# Patient Record
Sex: Male | Born: 1990 | Race: White | Hispanic: No | Marital: Married | Smoking: Never smoker
Health system: Southern US, Community
[De-identification: ages and names within clinical notes are randomized; demographics above are authoritative.]

---
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
## 2011-04-07 DIAGNOSIS — R079 Chest pain, unspecified: Secondary | ICD-10-CM | POA: Insufficient documentation

## 2011-04-07 DIAGNOSIS — J9801 Acute bronchospasm: Secondary | ICD-10-CM | POA: Insufficient documentation

## 2011-04-08 ENCOUNTER — Emergency Department (HOSPITAL_COMMUNITY)
Admission: EM | Admit: 2011-04-08 | Discharge: 2011-04-08 | Disposition: A | Discharge status: Home / Self Care | Payer: BC Managed Care – PPO | Attending: Emergency Medicine | Admitting: Emergency Medicine

## 2011-04-08 ENCOUNTER — Emergency Department (HOSPITAL_COMMUNITY): Payer: BC Managed Care – PPO

## 2011-04-08 ENCOUNTER — Encounter: Payer: Self-pay | Admitting: Emergency Medicine

## 2011-04-08 DIAGNOSIS — J9801 Acute bronchospasm: Secondary | ICD-10-CM

## 2011-04-08 LAB — CBC
HCT: 40.3 % (ref 39.0–52.0)
Hemoglobin: 14.5 g/dL (ref 13.0–17.0)
MCH: 30.9 pg (ref 26.0–34.0)
MCHC: 36 g/dL (ref 30.0–36.0)
MCV: 85.7 fL (ref 78.0–100.0)
Platelets: 238 10*3/uL (ref 150–400)
RBC: 4.7 MIL/uL (ref 4.22–5.81)
RDW: 12.7 % (ref 11.5–15.5)
WBC: 13.3 10*3/uL — ABNORMAL HIGH (ref 4.0–10.5)

## 2011-04-08 LAB — BASIC METABOLIC PANEL
BUN: 10 mg/dL (ref 6–23)
CO2: 27 mEq/L (ref 19–32)
Calcium: 9.7 mg/dL (ref 8.4–10.5)
Chloride: 103 mEq/L (ref 96–112)
Creatinine, Ser: 0.81 mg/dL (ref 0.50–1.35)
GFR calc Af Amer: >90 mL/min (ref >90)
GFR calc non Af Amer: >90 mL/min (ref >90)
Glucose, Bld: 124 mg/dL — ABNORMAL HIGH (ref 70–99)
Potassium: 4.4 mEq/L (ref 3.5–5.1)
Sodium: 140 mEq/L (ref 135–145)

## 2011-04-08 LAB — D-DIMER, QUANTITATIVE (NOT AT ARMC): D-Dimer, Quant: 0.27 ug/mL-FEU (ref 0.00–0.48)

## 2011-04-08 MED ORDER — FENTANYL CITRATE 0.05 MG/ML IJ SOLN
INTRAMUSCULAR | Status: AC
  Filled 2011-04-08: qty 2

## 2011-04-08 MED ORDER — ALBUTEROL SULFATE HFA 108 (90 BASE) MCG/ACT IN AERS
2.0000 | INHALATION_SPRAY | RESPIRATORY_TRACT | Status: DC
  Administered 2011-04-08: 2 via RESPIRATORY_TRACT
  Filled 2011-04-08: qty 6.7

## 2011-04-08 MED ORDER — ALBUTEROL SULFATE (5 MG/ML) 0.5% IN NEBU
5.0000 mg | INHALATION_SOLUTION | Freq: Once | RESPIRATORY_TRACT | Status: AC
  Administered 2011-04-08: 5 mg via RESPIRATORY_TRACT
  Filled 2011-04-08: qty 1

## 2011-04-08 MED ORDER — IPRATROPIUM BROMIDE 0.02 % IN SOLN
0.5000 mg | Freq: Once | RESPIRATORY_TRACT | Status: AC
  Administered 2011-04-08: 0.5 mg via RESPIRATORY_TRACT
  Filled 2011-04-08: qty 2.5

## 2011-04-08 NOTE — ED Provider Notes (Signed)
History     CSN: [id number] Arrival date & time: 04/08/2011 12:31 AM   First MD Initiated Contact with Patient 04/08/11 0105      Chief Complaint  Patient presents with  . Chest Pain    (Consider location/radiation/quality/duration/timing/severity/associated sxs/prior treatment) HPI Patient reports developing chest pain chest tightness and shortness of breath during a basketball game this evening.  He reports after stopping his chest pain improved slightly however he continued to feel short of breath.  He denies unilateral leg swelling.  He denies recent travel or surgery.  The family history of DVT or PE.  No family history of early cardiac disease.  He does report he is slightly nervous at this time.  His had cough with mild congestion for 24-48 hours.  He denies fever and chills.  He has no prior history of asthma.  He reports he has not played basketball as often as he usually does and feels as though he may be a well-developed shape however when the shortness of breath continued despite stopping he presented to the ER for evaluation.  He reports his symptoms are improved at this time without any therapy however he still is mildly dyspneic.  Nothing worsens the symptoms.  Nothing improves his symptoms.  His symptoms are mild to moderate at this time.  History reviewed. No pertinent past medical history.  History reviewed. No pertinent past surgical history.  No family history on file.  History  Substance Use Topics  . Smoking status: Never Smoker   . Smokeless tobacco: Not on file  . Alcohol Use: No      Review of Systems  All other systems reviewed and are negative.    Allergies  Review of patient's allergies indicates no known allergies.  Home Medications  No current outpatient prescriptions on file.  BP 120/69  Pulse 97  Temp(Src) 98.4 F (36.9 C) (Oral)  Resp 18  SpO2 100%  Physical Exam  Nursing note and vitals reviewed. Constitutional: He is oriented to  person, place, and time. He appears well-developed and well-nourished.  HENT:  Head: Normocephalic and atraumatic.  Eyes: EOM are normal.  Neck: Normal range of motion.  Cardiovascular: Normal rate, regular rhythm, normal heart sounds and intact distal pulses.   Pulmonary/Chest: Effort normal and breath sounds normal. No respiratory distress.  Abdominal: Soft. He exhibits no distension. There is no tenderness.  Musculoskeletal: Normal range of motion. He exhibits no edema.  Neurological: He is alert and oriented to person, place, and time.  Skin: Skin is warm and dry.  Psychiatric: He has a normal mood and affect. Judgment normal.    ED Course  Procedures (including critical care time)   Date: 04/08/2011  Rate: 91  Rhythm: normal sinus rhythm  QRS Axis: normal  Intervals: normal  ST/T Wave abnormalities: normal  Conduction Disutrbances:none  Narrative Interpretation:   Old EKG Reviewed: No significant changes noted     Labs Reviewed  CBC - Abnormal; Notable for the following:    WBC 13.3 (*)    All other components within normal limits  BASIC METABOLIC PANEL - Abnormal; Notable for the following:    Glucose, Bld 124 (*)    All other components within normal limits  D-DIMER, QUANTITATIVE   Dg Chest 2 View  04/08/2011  *RADIOLOGY REPORT*  Clinical Data: Shortness of breath and mid chest pain.  CHEST - 2 VIEW  Comparison: None.  Findings: The lungs are well-aerated and clear.  There is no evidence of focal  opacification, pleural effusion or pneumothorax.  The heart is normal in size; the mediastinal contour is within normal limits.  No acute osseous abnormalities are seen.  IMPRESSION: No acute cardiopulmonary process seen.  Original Report Authenticated By: Tonia Ghent, M.D.   I personally reviewed the x-ray  1. Bronchospasm       MDM  Given the patient's complaint is tachycardia d-dimer will be obtained.  My suspicion for pulmonary embolus but still very low.   Doubt ACS.  EKG is normal.  Chest x-ray is normal.  Baseline labs including a hemoglobin pending at this time.  He may have a component of bronchitis/bronchospasm and thus the treatment of albuterol and Atrovent as being tried at this time  3:06 AM Patient feels much better at this time.  His symptoms have completely resolved with albuterol treatment in the ER.  He'll be sent home with a metered-dose inhaler.  Labs chest x-ray EKG and d-dimer all without significant abnormality     Lyanne Co, MD 04/08/11 [id number]

## 2011-04-08 NOTE — ED Notes (Signed)
The pt finished his hhn feeling better  Falling asleep

## 2011-04-08 NOTE — ED Notes (Signed)
PT. REPORTS CHEST PAIN / SOB / CHEST CONGESTION ONSET THIS AFTERNOON WHILE PLAYING BASKETBALL.

## 2012-08-14 IMAGING — CR DG CHEST 2V
2 series · 2 of 2 positions shown · non-contrast
Comparison: None.

CLINICAL DATA: Shortness of breath and mid chest pain.

CHEST - 2 VIEW

[w chest pa]
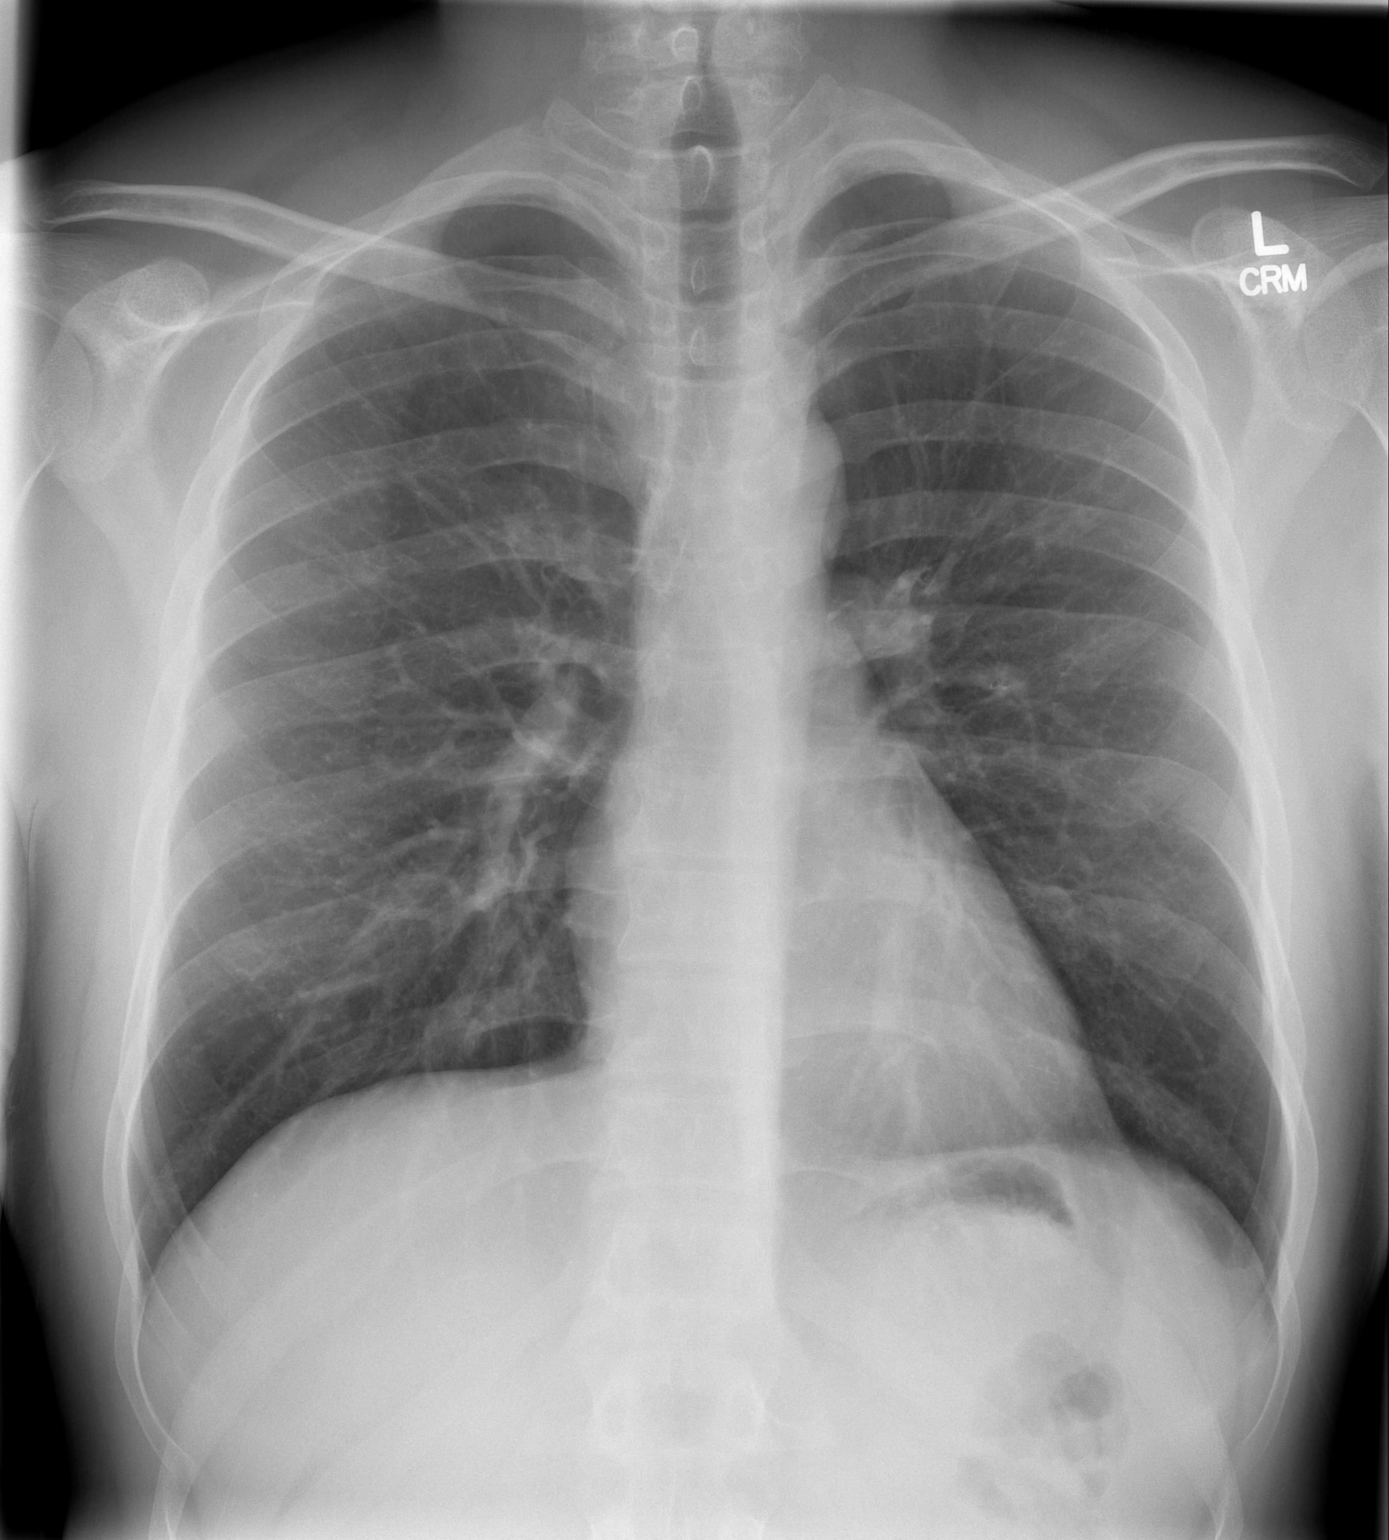

[w chest lat]
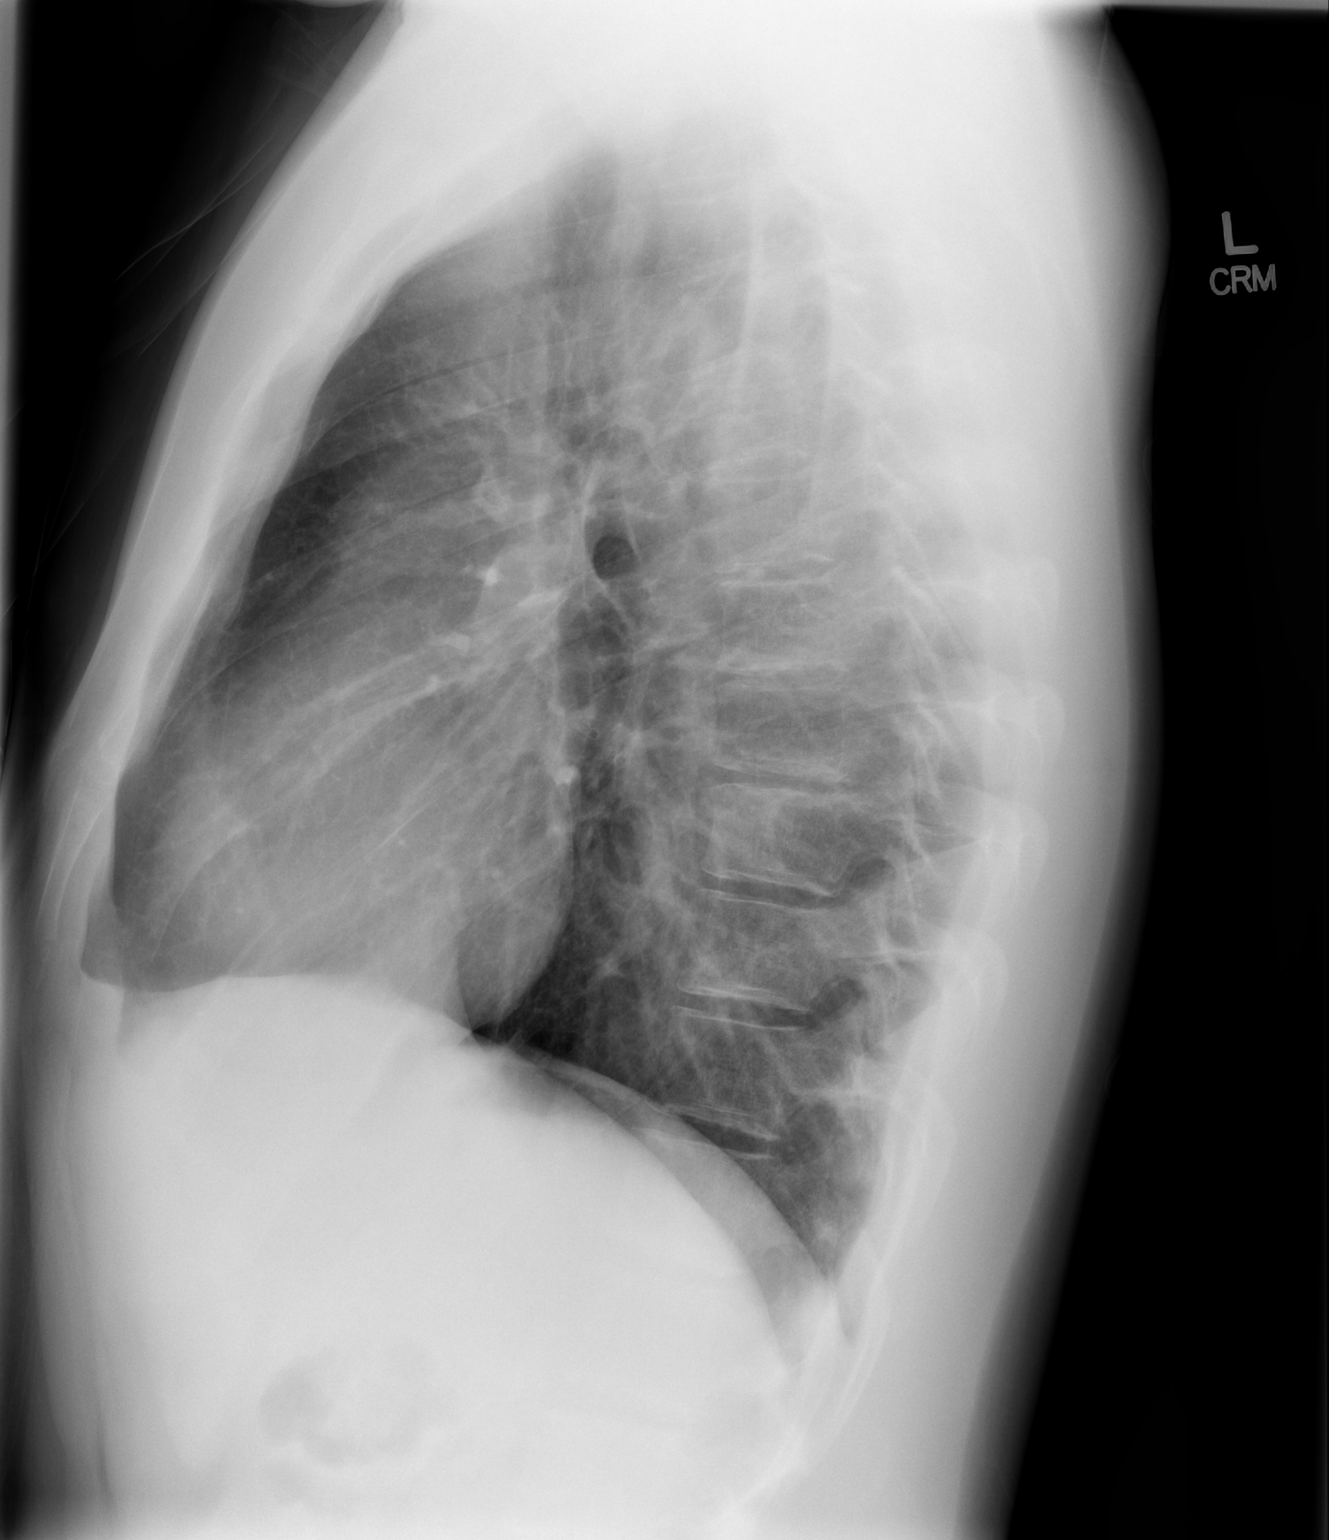

[2 of 2 positions shown; findings below may reference images not displayed]

FINDINGS: The lungs are well-aerated and clear.  There is no
evidence of focal opacification, pleural effusion or pneumothorax.

The heart is normal in size; the mediastinal contour is within
normal limits.  No acute osseous abnormalities are seen.
IMPRESSION: No acute cardiopulmonary process seen.

## 2021-01-25 ENCOUNTER — Emergency Department (HOSPITAL_BASED_OUTPATIENT_CLINIC_OR_DEPARTMENT_OTHER)
Admission: EM | Admit: 2021-01-25 | Discharge: 2021-01-25 | Disposition: A | Discharge status: Home / Self Care | Payer: Commercial Managed Care - PPO | Attending: Emergency Medicine | Admitting: Emergency Medicine

## 2021-01-25 ENCOUNTER — Other Ambulatory Visit: Payer: Self-pay

## 2021-01-25 ENCOUNTER — Encounter (HOSPITAL_BASED_OUTPATIENT_CLINIC_OR_DEPARTMENT_OTHER): Payer: Self-pay | Admitting: Emergency Medicine

## 2021-01-25 ENCOUNTER — Encounter: Payer: Self-pay | Admitting: Physician Assistant

## 2021-01-25 DIAGNOSIS — Z20822 Contact with and (suspected) exposure to covid-19: Secondary | ICD-10-CM | POA: Diagnosis not present

## 2021-01-25 DIAGNOSIS — K529 Noninfective gastroenteritis and colitis, unspecified: Secondary | ICD-10-CM | POA: Diagnosis not present

## 2021-01-25 DIAGNOSIS — R110 Nausea: Secondary | ICD-10-CM | POA: Diagnosis present

## 2021-01-25 DIAGNOSIS — A084 Viral intestinal infection, unspecified: Secondary | ICD-10-CM

## 2021-01-25 LAB — CBC
HCT: 45.9 % (ref 39.0–52.0)
Hemoglobin: 16.4 g/dL (ref 13.0–17.0)
MCH: 30.4 pg (ref 26.0–34.0)
MCHC: 35.7 g/dL (ref 30.0–36.0)
MCV: 85 fL (ref 80.0–100.0)
Platelets: 275 10*3/uL (ref 150–400)
RBC: 5.4 MIL/uL (ref 4.22–5.81)
RDW: 12.5 % (ref 11.5–15.5)
WBC: 15.2 10*3/uL — ABNORMAL HIGH (ref 4.0–10.5)
nRBC: 0 % (ref 0.0–0.2)

## 2021-01-25 LAB — URINALYSIS, ROUTINE W REFLEX MICROSCOPIC
Bilirubin Urine: NEGATIVE
Glucose, UA: NEGATIVE mg/dL
Hgb urine dipstick: NEGATIVE
Ketones, ur: NEGATIVE mg/dL
Leukocytes,Ua: NEGATIVE
Nitrite: NEGATIVE
Specific Gravity, Urine: 1.035 — ABNORMAL HIGH (ref 1.005–1.030)
pH: 5 (ref 5.0–8.0)

## 2021-01-25 LAB — COMPREHENSIVE METABOLIC PANEL
ALT: 41 U/L (ref 0–44)
AST: 23 U/L (ref 15–41)
Albumin: 4.9 g/dL (ref 3.5–5.0)
Alkaline Phosphatase: 53 U/L (ref 38–126)
Anion gap: 10 (ref 5–15)
BUN: 15 mg/dL (ref 6–20)
CO2: 24 mmol/L (ref 22–32)
Calcium: 9.9 mg/dL (ref 8.9–10.3)
Chloride: 104 mmol/L (ref 98–111)
Creatinine, Ser: 0.87 mg/dL (ref 0.61–1.24)
GFR, Estimated: >60 mL/min (ref >60)
Glucose, Bld: 121 mg/dL — ABNORMAL HIGH (ref 70–99)
Potassium: 4.3 mmol/L (ref 3.5–5.1)
Sodium: 138 mmol/L (ref 135–145)
Total Bilirubin: 2 mg/dL — ABNORMAL HIGH (ref 0.3–1.2)
Total Protein: 7.8 g/dL (ref 6.5–8.1)

## 2021-01-25 LAB — RESP PANEL BY RT-PCR (FLU A&B, COVID) ARPGX2
Influenza A by PCR: NEGATIVE
Influenza B by PCR: NEGATIVE
SARS Coronavirus 2 by RT PCR: NEGATIVE

## 2021-01-25 LAB — LIPASE, BLOOD: Lipase: 12 U/L (ref 11–51)

## 2021-01-25 MED ORDER — ONDANSETRON 4 MG PO TBDP: 4.0000 mg | ORAL_TABLET | Freq: Three times a day (TID) | ORAL | 0 refills | Status: AC | PRN

## 2021-01-25 MED ORDER — ONDANSETRON 4 MG PO TBDP
4.0000 mg | ORAL_TABLET | Freq: Once | ORAL | Status: AC
  Administered 2021-01-25: 4 mg via ORAL
  Filled 2021-01-25: qty 1

## 2021-01-25 MED ORDER — ACETAMINOPHEN 500 MG PO TABS
1000.0000 mg | ORAL_TABLET | Freq: Once | ORAL | Status: AC
  Administered 2021-01-25: 1000 mg via ORAL
  Filled 2021-01-25: qty 2

## 2021-01-25 NOTE — Progress Notes (Signed)
Patient scheduled appt but appears to have went to ED. Provider sent link to notify patient but no attempt to log in made. Suspect he decided to be seen in person instead. Marked erroneous

## 2021-01-25 NOTE — ED Provider Notes (Signed)
[facility] EMERGENCY DEPT Provider Note   CSN: [id number] Arrival date & time: 01/25/21  1633     History Chief Complaint  Patient presents with   Nausea   Diarrhea    Jared Gardner is a 30 y.o. male who presents with nausea and diarrhea that started earlier today.  Patient states that he woke up this morning with nausea that got worse throughout the day.  He also reports approximately 6 episodes of loose watery stools.  At that time had generalized abdominal aches.  Denies recent changes in diet, travel, or recent antibiotic use. No one else at home is sick.   Diarrhea Associated symptoms: abdominal pain and myalgias   Associated symptoms: no chills, no fever and no vomiting       History reviewed. No pertinent past medical history.  There are no problems to display for this patient.   History reviewed. No pertinent surgical history.     History reviewed. No pertinent family history.  Social History   Tobacco Use   Smoking status: Never   Smokeless tobacco: Never  Substance Use Topics   Alcohol use: Never   Drug use: Never    Home Medications Prior to Admission medications   Medication Sig Start Date End Date Taking? Authorizing Provider  ondansetron (ZOFRAN ODT) 4 MG disintegrating tablet Take 1 tablet (4 mg total) by mouth every 8 (eight) hours as needed for nausea or vomiting. 01/25/21  Yes Jailyn Langhorst T, PA-C    Allergies    Patient has no known allergies.  Review of Systems   Review of Systems  Constitutional:  Negative for chills and fever.  Respiratory:  Negative for shortness of breath.   Cardiovascular:  Negative for chest pain.  Gastrointestinal:  Positive for abdominal pain, diarrhea and nausea. Negative for blood in stool and vomiting.  Genitourinary:  Negative for dysuria and flank pain.  Musculoskeletal:  Positive for myalgias.  All other systems reviewed and are negative.  Physical Exam Updated Vital Signs BP (!)  116/59 (BP Location: Right Arm)   Pulse 93   Temp 100.1 F (37.8 C) (Oral)   Resp 16   Ht 6' (1.829 m)   Wt 99.8 kg   SpO2 98%   BMI 29.84 kg/m   Physical Exam Vitals and nursing note reviewed.  Constitutional:      Appearance: Normal appearance.  HENT:     Head: Normocephalic and atraumatic.  Eyes:     Conjunctiva/sclera: Conjunctivae normal.  Cardiovascular:     Rate and Rhythm: Normal rate and regular rhythm.  Pulmonary:     Effort: Pulmonary effort is normal. No respiratory distress.     Breath sounds: Normal breath sounds.  Abdominal:     General: There is no distension.     Palpations: Abdomen is soft.     Tenderness: There is no abdominal tenderness. There is no guarding or rebound.  Skin:    General: Skin is warm and dry.  Neurological:     General: No focal deficit present.     Mental Status: He is alert.    ED Results / Procedures / Treatments   Labs (all labs ordered are listed, but only abnormal results are displayed) Labs Reviewed  COMPREHENSIVE METABOLIC PANEL - Abnormal; Notable for the following components:      Result Value   Glucose, Bld 121 (*)    Total Bilirubin 2.0 (*)    All other components within normal limits  CBC - Abnormal; Notable  for the following components:   WBC 15.2 (*)    All other components within normal limits  URINALYSIS, ROUTINE W REFLEX MICROSCOPIC - Abnormal; Notable for the following components:   Specific Gravity, Urine 1.035 (*)    Protein, ur TRACE (*)    All other components within normal limits  RESP PANEL BY RT-PCR (FLU A&B, COVID) ARPGX2  LIPASE, BLOOD    EKG None  Radiology No results found.  Procedures Procedures   Medications Ordered in ED Medications  ondansetron (ZOFRAN-ODT) disintegrating tablet 4 mg (4 mg Oral Given 01/25/21 2037)  acetaminophen (TYLENOL) tablet 1,000 mg (1,000 mg Oral Given 01/25/21 2131)    ED Course  I have reviewed the triage vital signs and the nursing notes.  Pertinent  labs & imaging results that were available during my care of the patient were reviewed by me and considered in my medical decision making (see chart for details).    MDM Rules/Calculators/A&P                           Patient is 30 y/o male who presents for nausea, abdominal pain, and diarrhea starting this morning. He had approximately 6 episodes of non-bloody diarrhea today.  On exam patient is afebrile and in no acute distress. His abdomen is soft, nontender and non-distended.  He states that his symptoms began to improve approximately 2 hours before arriving in the emergency department.   CBC shows mild leukocytosis likely in the setting of viral gastroenteritis. CMP, urinalysis, and lipase unremarkable. On reevaluation patient had fever to 101.3 F, given tylenol. Abdominal exam remained stable.   Due to reassuring vital signs and physical exam, patient is not requiring admission or inpatient treatment for his symptoms. Patient's symptoms likely due to viral gastroenteritis.  Discussed that patient can continue taking over-the-counter medications for fever as needed.  Plan to discharge home with prescription for Zofran.  Discussed reasons to return to the ED.  Patient agreeable to plan.  Final Clinical Impression(s) / ED Diagnoses Final diagnoses:  Viral gastroenteritis    Rx / DC Orders ED Discharge Orders          Ordered    ondansetron (ZOFRAN ODT) 4 MG disintegrating tablet  Every 8 hours PRN        01/25/21 2203             Xzandria Clevinger T, PA-C 01/25/21 2216    Ernie Avena, MD 01/25/21 2333

## 2021-01-25 NOTE — ED Triage Notes (Signed)
Pt arrives to ED with c/o of nausea and diarrhea that started today. Pt reports waking up this morning with nausea that has continued throughout the day. Pt reports 6 episodes of diarrhea. Reports generalized abdominal aches, no sharp or throbbing pains. No vomiting. Reports chills throughout the day w/ fevers. Pt has taken no medications today.

## 2021-01-25 NOTE — Discharge Instructions (Addendum)
You were seen in the emergency department today for nausea and diarrhea.  Your lab work today was reassuring. I think your symptoms are likely due to a viral GI infection. This is normally treated symptomatically. I am writing your prescription for Zofran which is an antinausea medicine.  Make sure you are getting adequate fluids and rest.  Take over-the-counter medications as needed for fever.  Your COVID test is pending.  You should receive a call with the results.  If this is positive please follow CDC guidelines for isolation precautions for the next 5 days.  Can you monitor your symptoms and return to the emergency department for new or worsening symptoms such as abdominal pain, vomiting despite taking Zofran.

## 2021-04-14 ENCOUNTER — Encounter: Payer: Self-pay | Admitting: Podiatry

## 2021-04-14 ENCOUNTER — Other Ambulatory Visit: Payer: Self-pay

## 2021-04-14 ENCOUNTER — Ambulatory Visit (INDEPENDENT_AMBULATORY_CARE_PROVIDER_SITE_OTHER): Payer: Commercial Managed Care - PPO | Admitting: Podiatry

## 2021-04-14 DIAGNOSIS — B351 Tinea unguium: Secondary | ICD-10-CM

## 2021-04-14 NOTE — Progress Notes (Signed)
  Subjective:  Patient ID: Jared Gardner, male    DOB: 08-16-1990,   MRN: [id number]  No chief complaint on file.   30 y.o. male presents for concern of discolored and thickened toenails on bilateral feet that he has had for years. Has not tried any treatments thus far but wanted to have them evaluated.   . Denies any other pedal complaints. Denies n/v/f/c.   No past medical history on file.  Objective:  Physical Exam: Vascular: DP/PT pulses 2/4 bilateral. CFT <3 seconds. Normal hair growth on digits. No edema.  Skin. No lacerations or abrasions bilateral feet.  Musculoskeletal: MMT 5/5 bilateral lower extremities in DF, PF, Inversion and Eversion. Deceased ROM in DF of ankle joint.  Neurological: Sensation intact to light touch.   Assessment:  No diagnosis found.   Plan:  Patient was evaluated and treated and all questions answered. -Examined patient -Discussed treatment options for painful dystrophic nails  -Discussed fungal nail treatment options including oral, topical, and laser treatments.  -Recommend lamisil. Will send for LFTs upon return of labs with send in prescription for lamisil.  -Patient to return in 3 months for follow-up.   Louann Sjogren, DPM
